# Patient Record
Sex: Male | Born: 1970 | Race: Black or African American | Hispanic: No | Marital: Single | Smoking: Never smoker
Health system: Southern US, Community
[De-identification: ages and names within clinical notes are randomized; demographics above are authoritative.]

## PROBLEM LIST (undated history)

## (undated) DIAGNOSIS — IMO0002 Reserved for concepts with insufficient information to code with codable children: Secondary | ICD-10-CM

## (undated) HISTORY — PX: WRIST SURGERY: SHX841

## (undated) HISTORY — PX: OTHER SURGICAL HISTORY: SHX169

---

## 2005-03-06 ENCOUNTER — Emergency Department: Payer: Self-pay | Admitting: Internal Medicine

## 2009-09-21 ENCOUNTER — Emergency Department: Payer: Self-pay | Admitting: Emergency Medicine

## 2009-10-20 ENCOUNTER — Emergency Department: Payer: Self-pay | Admitting: Internal Medicine

## 2011-11-24 ENCOUNTER — Emergency Department (HOSPITAL_COMMUNITY): Payer: Self-pay

## 2011-11-24 ENCOUNTER — Emergency Department (HOSPITAL_COMMUNITY)
Admission: EM | Admit: 2011-11-24 | Discharge: 2011-11-24 | Disposition: A | Discharge status: Home / Self Care | Payer: Self-pay | Attending: Emergency Medicine | Admitting: Emergency Medicine

## 2011-11-24 ENCOUNTER — Encounter (HOSPITAL_COMMUNITY): Payer: Self-pay | Admitting: Emergency Medicine

## 2011-11-24 DIAGNOSIS — R51 Headache: Secondary | ICD-10-CM | POA: Insufficient documentation

## 2011-11-24 DIAGNOSIS — M542 Cervicalgia: Secondary | ICD-10-CM | POA: Insufficient documentation

## 2011-11-24 DIAGNOSIS — S139XXA Sprain of joints and ligaments of unspecified parts of neck, initial encounter: Secondary | ICD-10-CM | POA: Insufficient documentation

## 2011-11-24 DIAGNOSIS — IMO0002 Reserved for concepts with insufficient information to code with codable children: Secondary | ICD-10-CM | POA: Insufficient documentation

## 2011-11-24 HISTORY — DX: Reserved for concepts with insufficient information to code with codable children: IMO0002

## 2011-11-24 MED ORDER — NAPROXEN 500 MG PO TABS: 500.0000 mg | ORAL_TABLET | Freq: Two times a day (BID) | ORAL | Status: DC

## 2011-11-24 MED ORDER — OXYCODONE-ACETAMINOPHEN 5-325 MG PO TABS
1.0000 | ORAL_TABLET | Freq: Once | ORAL | Status: AC
  Administered 2011-11-24: 1 via ORAL
  Filled 2011-11-24: qty 1

## 2011-11-24 MED ORDER — CYCLOBENZAPRINE HCL 10 MG PO TABS: 10.0000 mg | ORAL_TABLET | Freq: Two times a day (BID) | ORAL | Status: DC | PRN

## 2011-11-24 MED ORDER — CYCLOBENZAPRINE HCL 10 MG PO TABS: 10.0000 mg | ORAL_TABLET | Freq: Two times a day (BID) | ORAL | Status: AC | PRN

## 2011-11-24 NOTE — Discharge Instructions (Signed)
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
## 2011-11-26 NOTE — ED Provider Notes (Signed)
Medical screening examination/treatment/procedure(s) were performed by non-physician practitioner and as supervising physician I was immediately available for consultation/collaboration.   Edric Fetterman A Estela Vinal, MD 11/26/11 1558 

## 2012-01-28 ENCOUNTER — Emergency Department (HOSPITAL_COMMUNITY)
Admission: EM | Admit: 2012-01-28 | Discharge: 2012-01-28 | Disposition: A | Discharge status: Home / Self Care | Payer: No Typology Code available for payment source | Attending: Emergency Medicine | Admitting: Emergency Medicine

## 2012-01-28 ENCOUNTER — Encounter (HOSPITAL_COMMUNITY): Payer: Self-pay | Admitting: Emergency Medicine

## 2012-01-28 ENCOUNTER — Emergency Department (HOSPITAL_COMMUNITY): Payer: No Typology Code available for payment source

## 2012-01-28 DIAGNOSIS — S139XXA Sprain of joints and ligaments of unspecified parts of neck, initial encounter: Secondary | ICD-10-CM | POA: Insufficient documentation

## 2012-01-28 DIAGNOSIS — M542 Cervicalgia: Secondary | ICD-10-CM | POA: Insufficient documentation

## 2012-01-28 DIAGNOSIS — Y92410 Unspecified street and highway as the place of occurrence of the external cause: Secondary | ICD-10-CM | POA: Insufficient documentation

## 2012-01-28 DIAGNOSIS — S161XXA Strain of muscle, fascia and tendon at neck level, initial encounter: Secondary | ICD-10-CM

## 2012-01-28 DIAGNOSIS — R51 Headache: Secondary | ICD-10-CM | POA: Insufficient documentation

## 2012-01-28 MED ORDER — OXYCODONE-ACETAMINOPHEN 5-325 MG PO TABS: 1.0000 | ORAL_TABLET | ORAL | Status: AC | PRN

## 2012-01-28 MED ORDER — IBUPROFEN 400 MG PO TABS
800.0000 mg | ORAL_TABLET | Freq: Once | ORAL | Status: AC
  Administered 2012-01-28: 800 mg via ORAL
  Filled 2012-01-28: qty 2
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
## 2013-05-03 ENCOUNTER — Emergency Department (HOSPITAL_COMMUNITY)
Admission: EM | Admit: 2013-05-03 | Discharge: 2013-05-03 | Disposition: A | Discharge status: Home / Self Care | Payer: No Typology Code available for payment source | Attending: Emergency Medicine | Admitting: Emergency Medicine

## 2013-05-03 ENCOUNTER — Emergency Department (HOSPITAL_COMMUNITY): Payer: No Typology Code available for payment source

## 2013-05-03 ENCOUNTER — Encounter (HOSPITAL_COMMUNITY): Payer: Self-pay | Admitting: Emergency Medicine

## 2013-05-03 DIAGNOSIS — Y92410 Unspecified street and highway as the place of occurrence of the external cause: Secondary | ICD-10-CM | POA: Insufficient documentation

## 2013-05-03 DIAGNOSIS — IMO0002 Reserved for concepts with insufficient information to code with codable children: Secondary | ICD-10-CM | POA: Insufficient documentation

## 2013-05-03 DIAGNOSIS — S0993XA Unspecified injury of face, initial encounter: Secondary | ICD-10-CM | POA: Insufficient documentation

## 2013-05-03 DIAGNOSIS — Y9389 Activity, other specified: Secondary | ICD-10-CM | POA: Insufficient documentation

## 2013-05-03 DIAGNOSIS — Z8739 Personal history of other diseases of the musculoskeletal system and connective tissue: Secondary | ICD-10-CM | POA: Insufficient documentation

## 2013-05-03 DIAGNOSIS — M542 Cervicalgia: Secondary | ICD-10-CM

## 2013-05-03 MED ORDER — NAPROXEN 500 MG PO TABS: 500.0000 mg | ORAL_TABLET | Freq: Two times a day (BID) | ORAL | Status: DC

## 2013-05-03 MED ORDER — KETOROLAC TROMETHAMINE 60 MG/2ML IM SOLN
60.0000 mg | Freq: Once | INTRAMUSCULAR | Status: AC
  Administered 2013-05-03: 60 mg via INTRAMUSCULAR
  Filled 2013-05-03: qty 2

## 2013-05-03 MED ORDER — OXYCODONE-ACETAMINOPHEN 5-325 MG PO TABS: 2.0000 | ORAL_TABLET | Freq: Once | ORAL | Status: DC

## 2013-05-03 MED ORDER — CYCLOBENZAPRINE HCL 10 MG PO TABS
10.0000 mg | ORAL_TABLET | Freq: Once | ORAL | Status: AC
  Administered 2013-05-03: 10 mg via ORAL
  Filled 2013-05-03: qty 1

## 2013-05-03 MED ORDER — CYCLOBENZAPRINE HCL 10 MG PO TABS: 10.0000 mg | ORAL_TABLET | Freq: Two times a day (BID) | ORAL | Status: DC | PRN

## 2013-05-03 NOTE — ED Provider Notes (Signed)
Medical screening examination/treatment/procedure(s) were performed by non-physician practitioner and as supervising physician I was immediately available for consultation/collaboration.  Jama Krichbaum L Marcayla Budge, MD 05/03/13 1542 
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
## 2013-05-03 NOTE — ED Notes (Signed)
Patient transported to X-ray 
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
## 2017-12-13 ENCOUNTER — Other Ambulatory Visit: Payer: Self-pay

## 2017-12-13 ENCOUNTER — Encounter (HOSPITAL_COMMUNITY): Payer: Self-pay | Admitting: Emergency Medicine

## 2017-12-13 ENCOUNTER — Ambulatory Visit (HOSPITAL_COMMUNITY)
Admission: EM | Admit: 2017-12-13 | Discharge: 2017-12-13 | Disposition: A | Discharge status: Home / Self Care | Payer: Self-pay | Attending: Family Medicine | Admitting: Family Medicine

## 2017-12-13 DIAGNOSIS — H40052 Ocular hypertension, left eye: Secondary | ICD-10-CM

## 2017-12-13 DIAGNOSIS — B029 Zoster without complications: Secondary | ICD-10-CM

## 2017-12-13 MED ORDER — VALACYCLOVIR HCL 1 G PO TABS: 1000.0000 mg | ORAL_TABLET | Freq: Three times a day (TID) | ORAL | 0 refills | Status: AC

## 2017-12-13 MED ORDER — ACYCLOVIR 800 MG PO TABS: 800.0000 mg | ORAL_TABLET | Freq: Every day | ORAL | 0 refills | Status: AC

## 2017-12-13 MED ORDER — TETRACAINE HCL 0.5 % OP SOLN
OPHTHALMIC | Status: AC
  Filled 2017-12-13: qty 4

## 2017-12-13 NOTE — ED Notes (Signed)
Pt discharged by provider.
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
## 2017-12-13 NOTE — ED Triage Notes (Signed)
[facility] a [facility] a painful rash to the left side of his face that started 1 week ago.

## 2017-12-13 NOTE — Discharge Instructions (Signed)
Start Valtrex or acyclovir as directed.  Follow-up with ophthalmology tomorrow for further evaluation needed.  If experiencing worsening symptoms overnight, go to the emergency department for further evaluation.

## 2017-12-14 ENCOUNTER — Telehealth (HOSPITAL_COMMUNITY): Payer: Self-pay

## 2017-12-14 NOTE — Telephone Encounter (Signed)
Pt returned to clinic to speak to this RN regarding follow up. He was told to follow up promptly with ophthalmology this morning due to Shingles near his eye. Pt unable to find an office to see him. [facility] and states they can [facility]. Assisted patient in making appointment to be seen today. Pt is appreciative and states he is going to appointment.

## 2019-06-10 ENCOUNTER — Other Ambulatory Visit: Payer: Self-pay

## 2019-06-10 DIAGNOSIS — Z20822 Contact with and (suspected) exposure to covid-19: Secondary | ICD-10-CM

## 2019-06-12 LAB — NOVEL CORONAVIRUS, NAA: SARS-CoV-2, NAA: NOT DETECTED

## 2020-08-30 ENCOUNTER — Encounter: Payer: Self-pay | Admitting: Family Medicine

## 2020-08-30 ENCOUNTER — Ambulatory Visit: Payer: Self-pay | Admitting: Family Medicine

## 2020-08-30 ENCOUNTER — Other Ambulatory Visit: Payer: Self-pay

## 2020-08-30 DIAGNOSIS — Z113 Encounter for screening for infections with a predominantly sexual mode of transmission: Secondary | ICD-10-CM

## 2020-08-30 NOTE — Progress Notes (Signed)
   [facility] STI clinic/screening visit  Subjective:  Thomas Ellison is a 50 y.o. male being seen today for an STI screening visit. The patient reports they do not have symptoms.    Patient has the following medical conditions:   Patient Active Problem List   Diagnosis Date Noted  . Herniated disc      Chief Complaint  Patient presents with  . STD screen  . SEXUALLY TRANSMITTED DISEASE    Screening     HPI  Patient reports here for screening.  Denies s/sx    See flowsheet for further details and programmatic requirements.    The following portions of the patient's history were reviewed and updated as appropriate: allergies, current medications, past medical history, past social history, past surgical history and problem list.  Objective:  There were no vitals filed for this visit.  Physical Exam Constitutional:      Appearance: Normal appearance.  HENT:     Head: Normocephalic.     Mouth/Throat:     Mouth: Mucous membranes are moist.     Pharynx: Oropharynx is clear. No oropharyngeal exudate.  Pulmonary:     Effort: Pulmonary effort is normal.  Genitourinary:    Penis: Normal.      Testes: Normal.     Comments: No lice, nits, or pest, no lesions or odor discharge.  Denies pain or tenderness with paplation of testicles.  No lesions, ulcers or masses present.    Musculoskeletal:     Cervical back: Normal range of motion.  Lymphadenopathy:     Cervical: No cervical adenopathy.  Skin:    General: Skin is warm and dry.     Findings: No bruising, erythema, lesion or rash.  Neurological:     Mental Status: He is alert and oriented to person, place, and time.  Psychiatric:        Mood and Affect: Mood normal.        Behavior: Behavior normal.       Assessment and Plan:  Thomas Ellison is a 50 y.o. male presenting to the [facility] Department for STI screening  1. Screening for venereal disease - Gram stain -  Gonococcus culture - HIV [place] [facility] - Syphilis Serology, [place] [facility]  Patient does not have STI symptoms Patient accepted all screenings including urethral GC, gram stain  and bloodwork for HIV/RPR.  Patient meets criteria for HepB screening? No. Ordered? No - does meet critera  Patient meets criteria for HepC screening? No. Ordered? No - does not meet critera  Recommended condom use with all sex Discussed importance of condom use for STI prevent  Treat gram stain per standing order Discussed time line for State Lab results and that patient will be called with positive results and encouraged patient to call if he had not heard in 2 weeks Recommended returning for continued or worsening symptoms.      No follow-ups on file.  No future appointments.  Wendi Snipes, FNP

## 2020-08-31 LAB — GRAM STAIN

## 2020-09-04 LAB — GONOCOCCUS CULTURE

## 2020-09-06 NOTE — Addendum Note (Signed)
Addended by: Wendi Snipes on: 09/06/2020 08:25 AM   Modules accepted: Orders

## 2022-10-08 ENCOUNTER — Ambulatory Visit: Payer: Self-pay | Admitting: Family Medicine

## 2022-10-08 ENCOUNTER — Encounter: Payer: Self-pay | Admitting: Family Medicine

## 2022-10-08 DIAGNOSIS — Z202 Contact with and (suspected) exposure to infections with a predominantly sexual mode of transmission: Secondary | ICD-10-CM

## 2022-10-08 DIAGNOSIS — Z113 Encounter for screening for infections with a predominantly sexual mode of transmission: Secondary | ICD-10-CM

## 2022-10-08 LAB — HM HIV SCREENING LAB: HM HIV Screening: NEGATIVE

## 2022-10-08 LAB — HM HEPATITIS C SCREENING LAB: HM Hepatitis Screen: NEGATIVE

## 2022-10-08 MED ORDER — METRONIDAZOLE 500 MG PO TABS: 2000.0000 mg | ORAL_TABLET | Freq: Once | ORAL | 0 refills | Status: AC

## 2022-10-08 NOTE — Progress Notes (Signed)
[facility] Department STI clinic/screening visit  Subjective:  Thomas Ellison is a 52 y.o. male being seen today for an STI screening visit. The patient reports they do not have symptoms.    Patient has the following medical conditions:   Patient Active Problem List   Diagnosis Date Noted   Herniated disc      Chief Complaint  Patient presents with   SEXUALLY TRANSMITTED DISEASE    STI screening-no symptoms.  Contact to [facility]    HPI  Patient reports to clinic as a contact to [facility] and for STI testing.   Last HIV test per patient/review of record was No results found for: "HMHIVSCREEN" No results found for: "HIV"  Does the patient or their partner desires a pregnancy in the next year? No  Screening for MPX risk: Does the patient have an unexplained rash? No Is the patient MSM? No Does the patient endorse multiple sex partners or anonymous sex partners? No Did the patient have close or sexual contact with a person diagnosed with MPX? No Has the patient traveled outside the [place] where MPX is endemic? No Is there a high clinical suspicion for MPX-- evidenced by one of the following No  -Unlikely to be chickenpox  -Lymphadenopathy  -Rash that present in same phase of evolution on any given body part   See flowsheet for further details and programmatic requirements.    There is no immunization history on file for this patient.   The following portions of the patient's history were reviewed and updated as appropriate: allergies, current medications, past medical history, past social history, past surgical history and problem list.  Objective:  There were no vitals filed for this visit.  Physical Exam Vitals and nursing note reviewed.  Constitutional:      Appearance: Normal appearance.  HENT:     Head: Normocephalic and atraumatic.     Mouth/Throat:     Mouth: Mucous membranes are moist.     Pharynx: No oropharyngeal exudate or posterior oropharyngeal  erythema.  Eyes:     General:        Right eye: No discharge.        Left eye: No discharge.     Conjunctiva/sclera:     Right eye: Right conjunctiva is not injected. No exudate.    Left eye: Left conjunctiva is not injected. No exudate. Pulmonary:     Effort: Pulmonary effort is normal.  Abdominal:     General: Abdomen is flat.     Palpations: Abdomen is soft. There is no hepatomegaly or mass.     Tenderness: There is no abdominal tenderness. There is no rebound.  Genitourinary:    Comments: Declined genital exam- asymptomatic Lymphadenopathy:     Cervical: No cervical adenopathy.     Upper Body:     Right upper body: No supraclavicular or axillary adenopathy.     Left upper body: No supraclavicular or axillary adenopathy.  Skin:    General: Skin is warm and dry.  Neurological:     Mental Status: He is alert and oriented to person, place, and time.     Assessment and Plan:  Thomas Ellison is a 52 y.o. male presenting to the [facility] Department for STI screening  1. Screening for venereal disease  - Chlamydia/GC NAA, Confirmation - HIV/HCV [place] [facility] - Syphilis Serology,  Lab - Gonococcus culture  2. Exposure to trichomonas  - metroNIDAZOLE (FLAGYL) 500 MG tablet; Take 4 tablets (2,000 mg  total) by mouth once for 1 dose.  Dispense: 4 tablet; Refill: 0   Patient does not have STI symptoms Patient accepted all screenings including  urine GC/Chlamydia, and blood work for HIV/Syphilis. Patient meets criteria for HepB screening? No. Ordered? not applicable Patient meets criteria for HepC screening? Yes. Ordered? yes Recommended condom use with all sex Discussed importance of condom use for STI prevent  Treat positive test results per standing order. Discussed time line for State Lab results and that patient will be called with positive results and encouraged patient to call if he had not heard in 2 weeks Recommended repeat testing in 3 months  with positive results. Recommended returning for continued or worsening symptoms.   Return if symptoms worsen or fail to improve.  No future appointments. Total time spent 20 minutes   Lenice Llamas, [place]

## 2022-10-08 NOTE — Progress Notes (Signed)
Pt here for STI screening and as contact to [facility].  No in-house labs performed.  Metronidazole  #4 take 4 tablets at one time.  Counseled on medication, side effects, plan of care and when to contact clinic for questions or concerns.  Verbalizes understanding.  Condoms declined.-Collins Scotland, RN

## 2022-10-13 LAB — CHLAMYDIA/GC NAA, CONFIRMATION
Chlamydia trachomatis, NAA: NEGATIVE
Neisseria gonorrhoeae, NAA: NEGATIVE

## 2022-10-13 LAB — GONOCOCCUS CULTURE

## 2022-11-25 NOTE — Addendum Note (Signed)
Addended by: Heywood Bene on: 11/25/2022 01:57 PM   Modules accepted: Orders

## 2022-12-23 ENCOUNTER — Ambulatory Visit: Payer: Self-pay

## 2023-01-15 ENCOUNTER — Ambulatory Visit: Payer: Self-pay | Admitting: Family Medicine

## 2023-01-15 ENCOUNTER — Encounter: Payer: Self-pay | Admitting: Family Medicine

## 2023-01-15 DIAGNOSIS — Z202 Contact with and (suspected) exposure to infections with a predominantly sexual mode of transmission: Secondary | ICD-10-CM

## 2023-01-15 DIAGNOSIS — Z113 Encounter for screening for infections with a predominantly sexual mode of transmission: Secondary | ICD-10-CM

## 2023-01-15 LAB — HM HIV SCREENING LAB: HM HIV Screening: NEGATIVE

## 2023-01-15 MED ORDER — METRONIDAZOLE 500 MG PO TABS: 500.0000 mg | ORAL_TABLET | Freq: Two times a day (BID) | ORAL | Status: DC

## 2023-01-15 MED ORDER — METRONIDAZOLE 500 MG PO TABS: 2000.0000 mg | ORAL_TABLET | Freq: Once | ORAL | Status: AC

## 2023-01-15 NOTE — Progress Notes (Signed)
[facility] Department STI clinic/screening visit  Subjective:  Thomas Ellison is a 52 y.o. male being seen today for an STI screening visit. The patient reports they do not have symptoms.    Patient has the following medical conditions:   Patient Active Problem List   Diagnosis Date Noted   Herniated disc      Chief Complaint  Patient presents with   SEXUALLY TRANSMITTED DISEASE    HPI  Patient reports to clinic for STI testing. Contact to trich- son threw out his meds from April, has not had time to come back to clinic  Last HIV test per patient/review of record was  Lab Results  Component Value Date   HMHIVSCREEN Negative - Validated 10/08/2022   No results found for: "HIV"  Does the patient or their partner desires a pregnancy in the next year? No  Screening for MPX risk: Does the patient have an unexplained rash? No Is the patient MSM? No Does the patient endorse multiple sex partners or anonymous sex partners? No Did the patient have close or sexual contact with a person diagnosed with MPX? No Has the patient traveled outside the [place] where MPX is endemic? No Is there a high clinical suspicion for MPX-- evidenced by one of the following No  -Unlikely to be chickenpox  -Lymphadenopathy  -Rash that present in same phase of evolution on any given body part   See flowsheet for further details and programmatic requirements.    There is no immunization history on file for this patient.   The following portions of the patient's history were reviewed and updated as appropriate: allergies, current medications, past medical history, past social history, past surgical history and problem list.  Objective:  There were no vitals filed for this visit.  Physical Exam Vitals and nursing note reviewed.  Constitutional:      Appearance: Normal appearance.  HENT:     Head: Normocephalic and atraumatic.     Mouth/Throat:     Mouth: Mucous membranes are moist.      Pharynx: No oropharyngeal exudate or posterior oropharyngeal erythema.  Eyes:     General:        Right eye: No discharge.        Left eye: No discharge.     Conjunctiva/sclera:     Right eye: Right conjunctiva is not injected. No exudate.    Left eye: Left conjunctiva is not injected. No exudate. Pulmonary:     Effort: Pulmonary effort is normal.  Abdominal:     General: Abdomen is flat.     Palpations: Abdomen is soft. There is no hepatomegaly or mass.     Tenderness: There is no abdominal tenderness. There is no rebound.  Genitourinary:    Comments: Declined genital exam- asymptomatic Lymphadenopathy:     Cervical: No cervical adenopathy.     Upper Body:     Right upper body: No supraclavicular or axillary adenopathy.     Left upper body: No supraclavicular or axillary adenopathy.  Skin:    General: Skin is warm and dry.  Neurological:     Mental Status: He is alert and oriented to person, place, and time.       Assessment and Plan:  Thomas Ellison is a 53 y.o. male presenting to the [facility] Department for STI screening  1. Exposure to trichomonas  - metroNIDAZOLE (FLAGYL) 500 MG tablet; Take 1 tablet (500 mg total) by mouth 2 (two) times daily for 7 days.  2. Screening for venereal disease  - HIV [place] [facility] - Syphilis Serology, [place] [facility] - Chlamydia/GC NAA, Confirmation   Patient does not have STI symptoms Patient accepted all screenings including  urine GC/Chlamydia, and blood work for HIV/Syphilis. Patient meets criteria for HepB screening? No. Ordered? not applicable Patient meets criteria for HepC screening? No. Ordered? not applicable Recommended condom use with all sex Discussed importance of condom use for STI prevent  Treat positive test results per standing order. Discussed time line for State Lab results and that patient will be called with positive results and encouraged patient to call if he had not heard in 2  weeks Recommended repeat testing in 3 months with positive results. Recommended returning for continued or worsening symptoms.   Return if symptoms worsen or fail to improve, for STI screening.  No future appointments. Total time spent 20 minutes  Lenice Llamas, [place]

## 2023-01-15 NOTE — Progress Notes (Signed)
The patient was dispensed metronidazole today. I provided counseling today regarding the medication. We discussed the medication, the side effects and when to call clinic. Patient given the opportunity to ask questions. Questions answered.   Condoms declined. Gaspar Garbe, RN
